# Patient Record
Sex: Male | Born: 1980 | Race: Black or African American | Hispanic: No | Marital: Single | Smoking: Never smoker
Health system: Southern US, Community
[De-identification: ages and names within clinical notes are randomized; demographics above are authoritative.]

## PROBLEM LIST (undated history)

## (undated) ENCOUNTER — Ambulatory Visit (HOSPITAL_COMMUNITY): Discharge status: Absent without leave | Payer: Self-pay

## (undated) DIAGNOSIS — M549 Dorsalgia, unspecified: Secondary | ICD-10-CM

---

## 2001-03-18 ENCOUNTER — Encounter: Payer: Self-pay | Admitting: Emergency Medicine

## 2001-03-18 ENCOUNTER — Emergency Department (HOSPITAL_COMMUNITY): Admission: EM | Admit: 2001-03-18 | Discharge: 2001-03-18 | Payer: Self-pay | Admitting: Emergency Medicine

## 2001-03-23 ENCOUNTER — Emergency Department (HOSPITAL_COMMUNITY): Admission: EM | Admit: 2001-03-23 | Discharge: 2001-03-23 | Payer: Self-pay | Admitting: *Deleted

## 2002-04-28 ENCOUNTER — Emergency Department (HOSPITAL_COMMUNITY): Admission: EM | Admit: 2002-04-28 | Discharge: 2002-04-28 | Payer: Self-pay | Admitting: Emergency Medicine

## 2002-05-07 ENCOUNTER — Emergency Department (HOSPITAL_COMMUNITY): Admission: EM | Admit: 2002-05-07 | Discharge: 2002-05-08 | Payer: Self-pay | Admitting: Emergency Medicine

## 2018-10-07 ENCOUNTER — Ambulatory Visit (HOSPITAL_COMMUNITY)
Admission: EM | Admit: 2018-10-07 | Discharge: 2018-10-07 | Disposition: A | Discharge status: Home / Self Care | Payer: Self-pay

## 2018-10-07 ENCOUNTER — Encounter (HOSPITAL_COMMUNITY): Payer: Self-pay | Admitting: Emergency Medicine

## 2018-10-07 ENCOUNTER — Ambulatory Visit (INDEPENDENT_AMBULATORY_CARE_PROVIDER_SITE_OTHER): Payer: Self-pay

## 2018-10-07 ENCOUNTER — Other Ambulatory Visit: Payer: Self-pay

## 2018-10-07 DIAGNOSIS — S40012A Contusion of left shoulder, initial encounter: Secondary | ICD-10-CM

## 2018-10-07 DIAGNOSIS — S39012A Strain of muscle, fascia and tendon of lower back, initial encounter: Secondary | ICD-10-CM

## 2018-10-07 DIAGNOSIS — W2211XA Striking against or struck by driver side automobile airbag, initial encounter: Secondary | ICD-10-CM

## 2018-10-07 DIAGNOSIS — M542 Cervicalgia: Secondary | ICD-10-CM

## 2018-10-07 DIAGNOSIS — M545 Low back pain: Secondary | ICD-10-CM

## 2018-10-07 DIAGNOSIS — S161XXA Strain of muscle, fascia and tendon at neck level, initial encounter: Secondary | ICD-10-CM

## 2018-10-07 HISTORY — DX: Dorsalgia, unspecified: M54.9

## 2018-10-07 MED ORDER — NAPROXEN 500 MG PO TABS: 500.0000 mg | ORAL_TABLET | Freq: Two times a day (BID) | ORAL | 0 refills | Status: AC

## 2018-10-07 MED ORDER — CYCLOBENZAPRINE HCL 5 MG PO TABS: 5.0000 mg | ORAL_TABLET | Freq: Every day | ORAL | 0 refills | Status: AC
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
## 2018-10-07 NOTE — Discharge Instructions (Signed)
Your xrays are normal today, no visualized broken bones.  Likely bruising and strains causing your pain.  Light and regular activity as tolerated. Antiinflammatory naproxen twice a day, take with food.  Flexeril at night as a muscle relaxer. May cause drowsiness. Please do not take if driving or drinking alcohol.  If develop increased weakness, numbness, tingling, dizziness, incontinence, or otherwise worsening please go to the ER.  Continue to follow with your PCP and/or your back specialist as may require further treatment if symptoms persist.
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
